# Patient Record
Sex: Male | Born: 1960 | Race: White | Hispanic: No | Marital: Married | Smoking: Never smoker
Health system: Southern US, Community
[De-identification: ages and names within clinical notes are randomized; demographics above are authoritative.]

## PROBLEM LIST (undated history)

## (undated) DIAGNOSIS — E119 Type 2 diabetes mellitus without complications: Secondary | ICD-10-CM

## (undated) HISTORY — DX: Type 2 diabetes mellitus without complications: E11.9

---

## 2009-06-15 ENCOUNTER — Encounter: Admission: RE | Admit: 2009-06-15 | Discharge: 2009-06-15 | Payer: Self-pay | Admitting: Unknown Physician Specialty

## 2009-10-19 ENCOUNTER — Encounter: Admission: RE | Admit: 2009-10-19 | Discharge: 2009-10-19 | Payer: Self-pay | Admitting: Unknown Physician Specialty

## 2010-02-25 ENCOUNTER — Encounter: Admission: RE | Admit: 2010-02-25 | Discharge: 2010-02-25 | Payer: Self-pay | Admitting: Unknown Physician Specialty

## 2011-04-21 IMAGING — CR DG SHOULDER 2+V*R*
3 series · 3 of 3 positions shown · non-contrast
Comparison: None.

CLINICAL DATA: Rotator cuff surgery.  Pain.

RIGHT SHOULDER - 2+ VIEW

[view not recorded (1 of 3)]
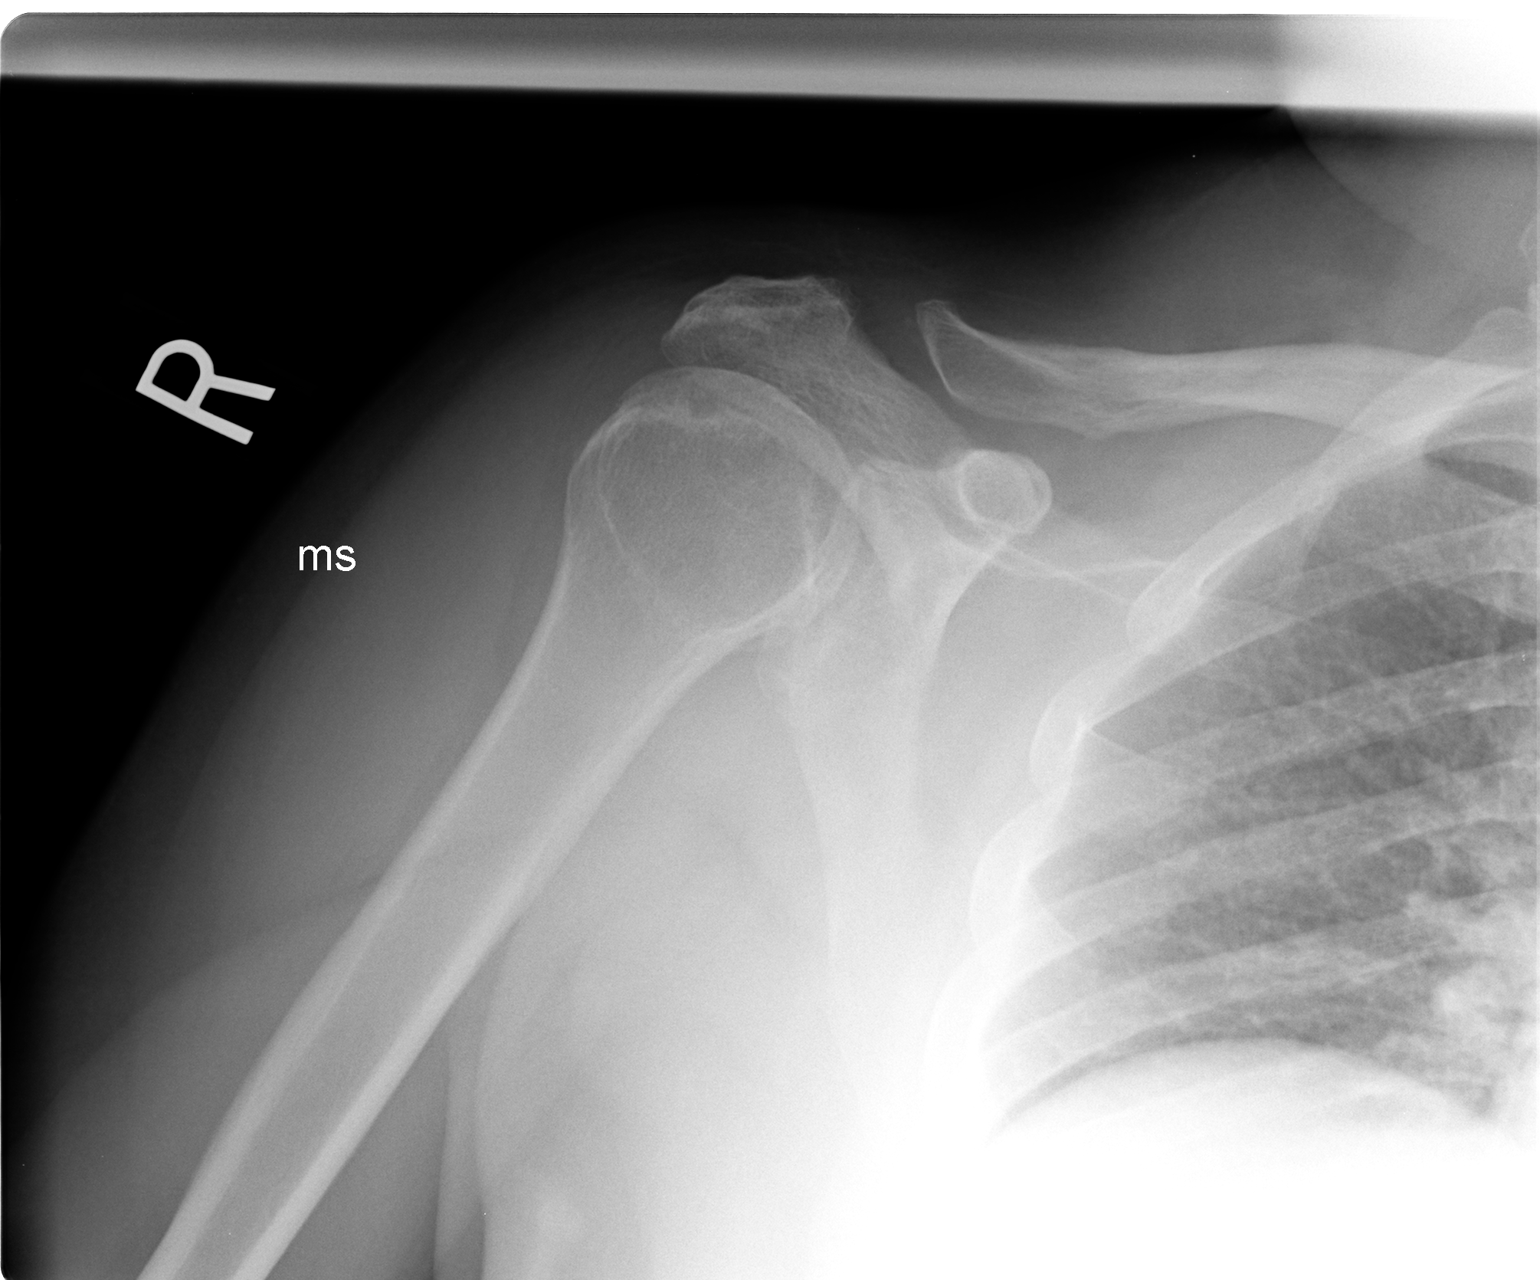

[view not recorded (2 of 3)]
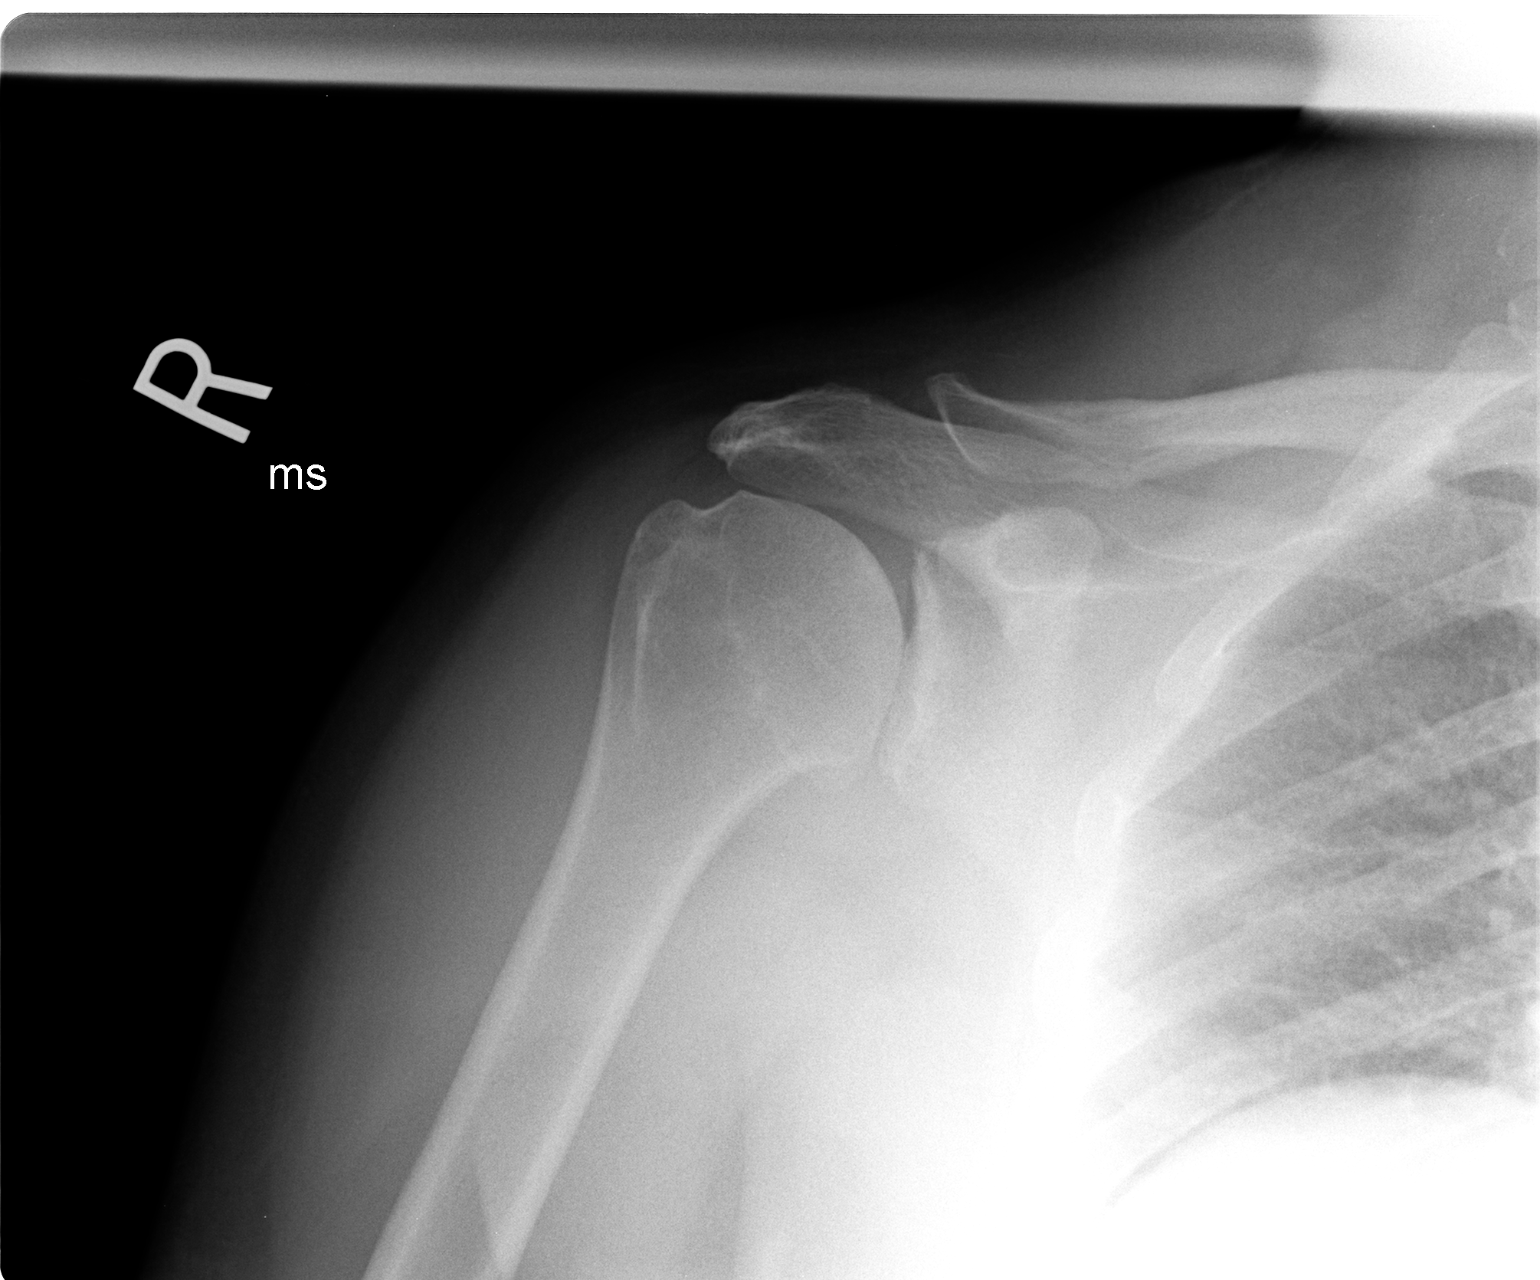

[view not recorded (3 of 3)]
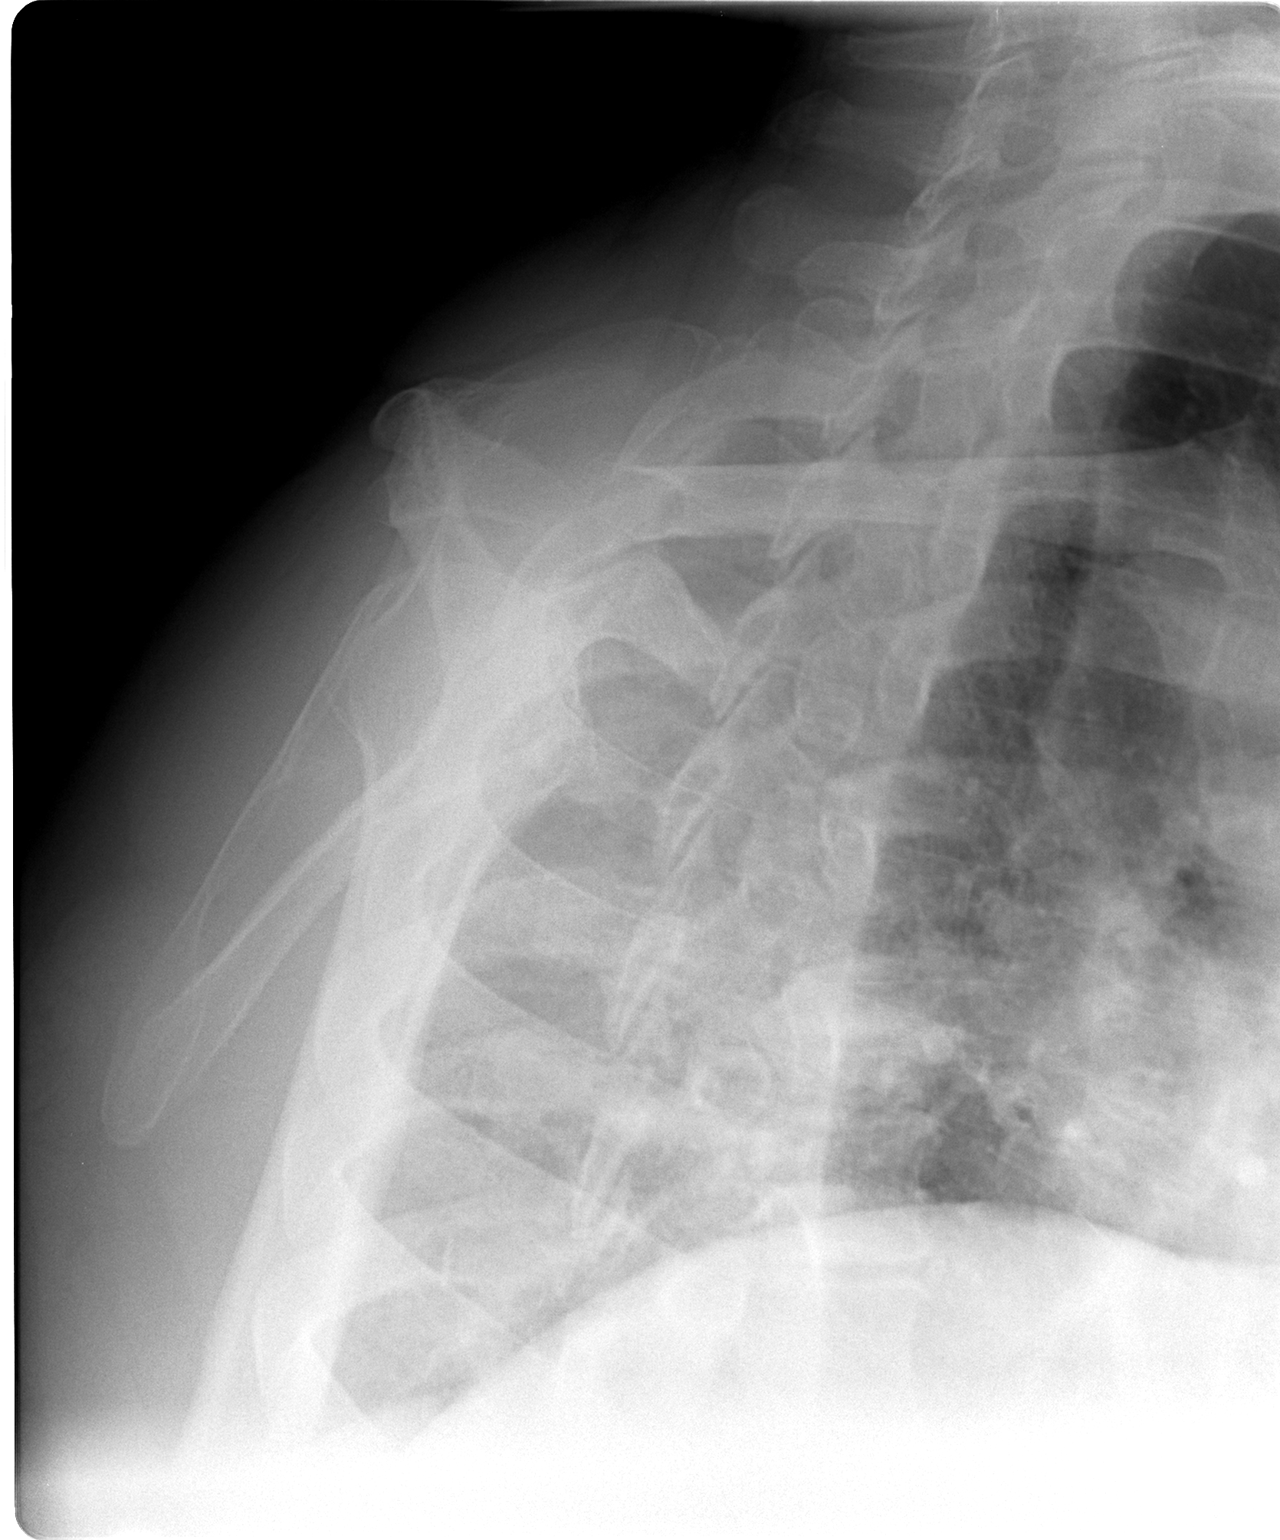

[3 of 3 positions shown; findings below may reference images not displayed]

FINDINGS: No evidence for fracture.  No subluxation or dislocation.
No shoulder separation.  Loss of joint space and spurring in the
glenohumeral joint is consistent with degenerative change.
IMPRESSION: Degenerative changes without acute bony findings.

## 2015-12-15 ENCOUNTER — Emergency Department (INDEPENDENT_AMBULATORY_CARE_PROVIDER_SITE_OTHER)
Admission: EM | Admit: 2015-12-15 | Discharge: 2015-12-15 | Disposition: A | Discharge status: Home / Self Care | Payer: Managed Care, Other (non HMO) | Source: Home / Self Care | Attending: Family Medicine | Admitting: Family Medicine

## 2015-12-15 ENCOUNTER — Encounter: Payer: Self-pay | Admitting: Emergency Medicine

## 2015-12-15 DIAGNOSIS — Z8709 Personal history of other diseases of the respiratory system: Secondary | ICD-10-CM | POA: Diagnosis not present

## 2015-12-15 DIAGNOSIS — J069 Acute upper respiratory infection, unspecified: Secondary | ICD-10-CM

## 2015-12-15 MED ORDER — ACETAMINOPHEN-CODEINE 120-12 MG/5ML PO SUSP: 10.0000 mL | Freq: Three times a day (TID) | ORAL | Status: AC | PRN

## 2015-12-15 MED ORDER — PREDNISONE 20 MG PO TABS: ORAL_TABLET | ORAL | Status: AC

## 2015-12-15 MED ORDER — AZITHROMYCIN 250 MG PO TABS: 250.0000 mg | ORAL_TABLET | Freq: Every day | ORAL | Status: AC

## 2015-12-15 MED ORDER — ALBUTEROL SULFATE HFA 108 (90 BASE) MCG/ACT IN AERS: 1.0000 | INHALATION_SPRAY | Freq: Four times a day (QID) | RESPIRATORY_TRACT | Status: AC | PRN

## 2015-12-15 NOTE — ED Provider Notes (Signed)
CSN: [id number]     Arrival date & time 12/15/15  0932 History   First MD Initiated Contact with Patient 12/15/15 1023     Chief Complaint  Patient presents with  . Cough  . URI   (Consider location/radiation/quality/duration/timing/severity/associated sxs/prior Treatment) HPI  Pt is a 55yo male presenting to [facility] with c/o dry hacking cough with facial pain and pressure, and sinus drainage that has been gradually worsening for 4-5 days.  He has been took expired cough syrup with codeine last night to help him sleep with moderate temporary relief of symptoms.    Others have also been sick at home but he states his cough is worse.  He does have a hx of asthma and states he "probably" should have an inhaler at home but he does not. He does use a CPAP machine at night.   History reviewed. No pertinent past medical history. History reviewed. No pertinent past surgical history. History reviewed. No pertinent family history. Social History  Substance Use Topics  . Smoking status: Never Smoker   . Smokeless tobacco: None  . Alcohol Use: Yes    Review of Systems  Constitutional: Positive for fever, chills and fatigue.  HENT: Positive for congestion, rhinorrhea, sore throat and voice change. Negative for ear pain and trouble swallowing.   Respiratory: Positive for cough, shortness of breath and wheezing.   Cardiovascular: Negative for chest pain and palpitations.  Gastrointestinal: Negative for nausea, vomiting, abdominal pain and diarrhea.  Musculoskeletal: Positive for myalgias and arthralgias. Negative for back pain.  Skin: Negative for rash.  Neurological: Positive for headaches. Negative for dizziness and light-headedness.    Allergies  Penicillins  Home Medications   Prior to Admission medications   Medication Sig Start Date End Date Taking? Authorizing Provider  acetaminophen-codeine 120-12 MG/5ML suspension Take 10 mLs by mouth every 8 (eight) hours as needed for pain. 12/15/15    Junius Finner, PA-C  albuterol (PROVENTIL HFA;VENTOLIN HFA) 108 (90 Base) MCG/ACT inhaler Inhale 1-2 puffs into the lungs every 6 (six) hours as needed for wheezing or shortness of breath. 12/15/15   Junius Finner, PA-C  azithromycin (ZITHROMAX) 250 MG tablet Take 1 tablet (250 mg total) by mouth daily. Take first 2 tablets together, then 1 every day until finished. 12/15/15   Junius Finner, PA-C  predniSONE (DELTASONE) 20 MG tablet 3 tabs po day one, then 2 po daily x 4 days 12/15/15   Junius Finner, PA-C   Meds Ordered and Administered this Visit  Medications - No data to display  BP 132/83 mmHg  Pulse 75  Temp(Src) 98.5 F (36.9 C) (Oral)  Resp 16  Ht  (1.727 m)  Wt 225 lb (102.059 kg)  BMI 34.22 kg/m2  SpO2 98% No data found.   Physical Exam  Constitutional: He is oriented to person, place, and time. He appears well-developed and well-nourished.  HENT:  Head: Normocephalic and atraumatic.  Right Ear: Hearing, tympanic membrane, external ear and ear canal normal.  Left Ear: Hearing, tympanic membrane, external ear and ear canal normal.  Nose: Mucosal edema and rhinorrhea present. Right sinus exhibits no maxillary sinus tenderness and no frontal sinus tenderness. Left sinus exhibits no maxillary sinus tenderness and no frontal sinus tenderness.  Mouth/Throat: Uvula is midline and mucous membranes are normal. Posterior oropharyngeal erythema present. No oropharyngeal exudate, posterior oropharyngeal edema or tonsillar abscesses.  Eyes: EOM are normal.  Neck: Normal range of motion. Neck supple.  Cardiovascular: Normal rate.   Pulmonary/Chest: Effort normal.  No stridor. He has decreased breath sounds in the right lower field and the left lower field. He has wheezes.  Musculoskeletal: Normal range of motion.  Lymphadenopathy:    He has no cervical adenopathy.  Neurological: He is alert and oriented to person, place, and time.  Skin: Skin is warm and dry.  Psychiatric: He has a  normal mood and affect. His behavior is normal.  Nursing note and vitals reviewed.   ED Course  Procedures (including critical care time)  Labs Review Labs Reviewed - No data to display  Imaging Review No results found.    MDM   1. Acute upper respiratory infection   2. History of asthma     Pt c/o worsening URI symptoms for 4-5 days.  Hx of asthma.  Wheeze on exam with decreased breath sounds in lower lung fields but no respiratory distress.  Rx: azithromycin, albuterol, prednisone, and acetaminophen-codeine.  Advised pt to use acetaminophen and ibuprofen as needed for fever and pain. Encouraged rest and fluids. F/u with PCP in 7-10 days if not improving, sooner if worsening. Pt verbalized understanding and agreement with tx plan.     Junius Finner, PA-C 12/15/15 1156

## 2015-12-15 NOTE — Discharge Instructions (Signed)
You may take 400-600mg Ibuprofen (Motrin) every 6-8 hours for fever and pain  °Follow-up with your primary care provider next week for recheck of symptoms if not improving.  °Be sure to drink plenty of fluids and rest, at least 8hrs of sleep a night, preferably more while you are sick. °Return urgent care or go to closest ER if you cannot keep down fluids/signs of dehydration, fever not reducing with Tylenol, difficulty breathing/wheezing, stiff neck, worsening condition, or other concerns (see below)  °Please take antibiotics as prescribed and be sure to complete entire course even if you start to feel better to ensure infection does not come back. ° ° °

## 2015-12-15 NOTE — ED Notes (Signed)
Patient presents to the [facility] with C/O dry hacking deep cough facial pressure with drainage taking OTC meds without relief. States that he did take Codeine cough syrup last PM with relief.

## 2022-09-29 ENCOUNTER — Encounter: Payer: Self-pay | Admitting: Emergency Medicine

## 2022-09-29 ENCOUNTER — Ambulatory Visit
Admission: EM | Admit: 2022-09-29 | Discharge: 2022-09-29 | Disposition: A | Discharge status: Home / Self Care | Payer: BC Managed Care – PPO

## 2022-09-29 DIAGNOSIS — J0100 Acute maxillary sinusitis, unspecified: Secondary | ICD-10-CM

## 2022-09-29 DIAGNOSIS — J309 Allergic rhinitis, unspecified: Secondary | ICD-10-CM

## 2022-09-29 DIAGNOSIS — R059 Cough, unspecified: Secondary | ICD-10-CM

## 2022-09-29 MED ORDER — BENZONATATE 200 MG PO CAPS: 200.0000 mg | ORAL_CAPSULE | Freq: Three times a day (TID) | ORAL | 0 refills | Status: AC | PRN

## 2022-09-29 MED ORDER — FEXOFENADINE HCL 180 MG PO TABS: 180.0000 mg | ORAL_TABLET | Freq: Every day | ORAL | 0 refills | Status: AC

## 2022-09-29 MED ORDER — DOXYCYCLINE HYCLATE 100 MG PO CAPS: 100.0000 mg | ORAL_CAPSULE | Freq: Two times a day (BID) | ORAL | 0 refills | Status: AC

## 2022-09-29 MED ORDER — PROMETHAZINE-DM 6.25-15 MG/5ML PO SYRP: 5.0000 mL | ORAL_SOLUTION | Freq: Two times a day (BID) | ORAL | 0 refills | Status: AC | PRN

## 2022-09-29 NOTE — ED Provider Notes (Signed)
[facility]    CSN: [id number] Arrival date & time: 09/29/22  1823      History   Chief Complaint Chief Complaint  Patient presents with   Cough    HPI Eric Shah is a 61 y.o. male.   HPI 61 year old male presents with cough, nasal congestion for 5 days.   Past Medical History:  Diagnosis Date   Diabetes mellitus without complication (HCC)     There are no problems to display for this patient.   History reviewed. No pertinent surgical history.     Home Medications    Prior to Admission medications   Medication Sig Start Date End Date Taking? Authorizing Provider  albuterol (PROVENTIL HFA;VENTOLIN HFA) 108 (90 Base) MCG/ACT inhaler Inhale 1-2 puffs into the lungs every 6 (six) hours as needed for wheezing or shortness of breath. 12/15/15  Yes Phelps, Erin O, PA-C  benzonatate (TESSALON) 200 MG capsule Take 1 capsule (200 mg total) by mouth 3 (three) times daily as needed for up to 7 days. 09/29/22 10/06/22 Yes Trevor Iha, FNP  doxycycline (VIBRAMYCIN) 100 MG capsule Take 1 capsule (100 mg total) by mouth 2 (two) times daily for 7 days. 09/29/22 10/06/22 Yes Trevor Iha, FNP  fexofenadine [facility] ALLERGY) 180 MG tablet Take 1 tablet (180 mg total) by mouth daily for 15 days. 09/29/22 10/14/22 Yes Trevor Iha, FNP  losartan (COZAAR) 50 MG tablet Take by mouth. 07/24/22  Yes [provider]  metFORMIN (GLUCOPHAGE-XR) 500 MG 24 hr tablet Take by mouth. 05/12/21  Yes [provider]  OZEMPIC, 0.25 OR 0.5 MG/DOSE, 2 MG/3ML SOPN Inject into the skin. 08/19/22  Yes [provider]  pravastatin (PRAVACHOL) 20 MG tablet  10/05/14  Yes [provider]  promethazine-dextromethorphan (PROMETHAZINE-DM) 6.25-15 MG/5ML syrup Take 5 mLs by mouth 2 (two) times daily as needed for cough. 09/29/22  Yes Trevor Iha, FNP  traZODone (DESYREL) 50 MG tablet Take by mouth. 08/19/22  Yes [provider]  acetaminophen-codeine  120-12 MG/5ML suspension Take 10 mLs by mouth every 8 (eight) hours as needed for pain. 12/15/15   Lurene Shadow, PA-C  azithromycin (ZITHROMAX) 250 MG tablet Take 1 tablet (250 mg total) by mouth daily. Take first 2 tablets together, then 1 every day until finished. 12/15/15   Lurene Shadow, PA-C  predniSONE (DELTASONE) 20 MG tablet 3 tabs po day one, then 2 po daily x 4 days 12/15/15   Rolla Plate    Family History History reviewed. No pertinent family history.  Social History Social History   Tobacco Use   Smoking status: Never   Smokeless tobacco: Never  Vaping Use   Vaping Use: Never used  Substance Use Topics   Alcohol use: Yes    Comment: occ   Drug use: Never     Allergies   Bee venom, Sulfa antibiotics, and Penicillins   Review of Systems Review of Systems   Physical Exam Triage Vital Signs ED Triage Vitals  Enc Vitals Group     BP      Pulse      Resp      Temp      Temp src      SpO2      Weight      Height      Head Circumference      Peak Flow      Pain Score      Pain Loc      Pain Edu?  Excl. in GC?    No data found.  Updated Vital Signs BP (!) 164/89 (BP Location: Left Arm)   Pulse 76   Temp 98.7 F (37.1 C) (Oral)   Resp 18   Ht 5\' 8"  (1.727 m)   Wt 227 lb (103 kg)   SpO2 96%   BMI 34.52 kg/m      Physical Exam Vitals and nursing note reviewed.  Constitutional:      Appearance: Normal appearance. He is obese.  HENT:     Head: Normocephalic and atraumatic.     Right Ear: Tympanic membrane, ear canal and external ear normal.     Left Ear: Tympanic membrane, ear canal and external ear normal.     Mouth/Throat:     Mouth: Mucous membranes are moist.     Pharynx: Oropharynx is clear.     Comments: Significant amount of clear drainage of posterior oropharynx noted Eyes:     Extraocular Movements: Extraocular movements intact.     Conjunctiva/sclera: Conjunctivae normal.     Pupils: Pupils are equal, round, and  reactive to light.  Cardiovascular:     Rate and Rhythm: Normal rate and regular rhythm.     Pulses: Normal pulses.     Heart sounds: Normal heart sounds.  Pulmonary:     Effort: Pulmonary effort is normal.     Breath sounds: Normal breath sounds. No wheezing, rhonchi or rales.     Comments: Infrequent nonproductive cough noted on exam Musculoskeletal:        General: Normal range of motion.     Cervical back: Normal range of motion and neck supple.  Skin:    General: Skin is warm and dry.  Neurological:     General: No focal deficit present.     Mental Status: He is alert and oriented to person, place, and time. Mental status is at baseline.      UC Treatments / Results  Labs (all labs ordered are listed, but only abnormal results are displayed) Labs Reviewed - No data to display  EKG   Radiology No results found.  Procedures Procedures (including critical care time)  Medications Ordered in UC Medications - No data to display  Initial Impression / Assessment and Plan / UC Course  I have reviewed the triage vital signs and the nursing notes.  Pertinent labs & imaging results that were available during my care of the patient were reviewed by me and considered in my medical decision making (see chart for details).     MDM: 1.  Acute maxillary sinusitis, recurrence not specified -Rx'd doxycycline; 2.  Cough-Rx'd Tessalon, Promethazine DM; 3.  Allergic rhinitis-Rx'd Allegra. Advised patient to take medication as directed with food to completion.  Advised patient to take Allegra with first dose of doxycycline for the next 5 of 7 days.  Advised may use Allegra as needed afterwards for concurrent postnasal drainage/drip.  Advised may take Tessalon perles daily or as needed for cough.  Advised may take Promethazine DM at night prior to sleep for cough due to sedative effects.  Advised patient not to take cough medications together.  Encouraged increase daily water intake to 64  ounces per day while taking these medications.  Advised if symptoms worsen and/or unresolved please follow-up with PCP or here for further evaluation.  Patient discharged home, hemodynamically stable. Final Clinical Impressions(s) / UC Diagnoses   Final diagnoses:  Cough, unspecified type  Acute maxillary sinusitis, recurrence not specified  Allergic rhinitis, unspecified seasonality,  unspecified trigger     Discharge Instructions      Advised patient to take medication as directed with food to completion.  Advised patient to take Allegra with first dose of doxycycline for the next 5 of 7 days.  Advised may use Allegra as needed afterwards for concurrent postnasal drainage/drip.  Advised may take Tessalon perles daily or as needed for cough.  Advised may take Promethazine DM at night prior to sleep for cough due to sedative effects.  Advised patient not to take cough medications together.  Encouraged increase daily water intake to 64 ounces per day while taking these medications.  Advised if symptoms worsen and/or unresolved please follow-up with PCP or here for further evaluation.     ED Prescriptions     Medication Sig Dispense Auth. Provider   doxycycline (VIBRAMYCIN) 100 MG capsule Take 1 capsule (100 mg total) by mouth 2 (two) times daily for 7 days. 14 capsule Trevor Iha, FNP   benzonatate (TESSALON) 200 MG capsule Take 1 capsule (200 mg total) by mouth 3 (three) times daily as needed for up to 7 days. 40 capsule Trevor Iha, FNP   fexofenadine [facility] ALLERGY) 180 MG tablet Take 1 tablet (180 mg total) by mouth daily for 15 days. 15 tablet Trevor Iha, FNP   promethazine-dextromethorphan (PROMETHAZINE-DM) 6.25-15 MG/5ML syrup Take 5 mLs by mouth 2 (two) times daily as needed for cough. 118 mL Trevor Iha, FNP      PDMP not reviewed this encounter.   Trevor Iha, FNP 09/29/22 1929

## 2022-09-29 NOTE — ED Triage Notes (Signed)
Patient c/o cough, congestion x 5 days.  Patient was seen and tested negative for COVID and flu.  Cough is worse at night.  Completed all meds given and no better.

## 2022-09-29 NOTE — Discharge Instructions (Addendum)
Advised patient to take medication as directed with food to completion.  Advised patient to take Allegra with first dose of doxycycline for the next 5 of 7 days.  Advised may use Allegra as needed afterwards for concurrent postnasal drainage/drip.  Advised may take Tessalon perles daily or as needed for cough.  Advised may take Promethazine DM at night prior to sleep for cough due to sedative effects.  Advised patient not to take cough medications together.  Encouraged increase daily water intake to 64 ounces per day while taking these medications.  Advised if symptoms worsen and/or unresolved please follow-up with PCP or here for further evaluation.
